# Patient Record
Sex: Female | Born: 1955 | Race: White | Hispanic: No | Marital: Single | Smoking: Never smoker
Health system: Southern US, Community
[De-identification: ages and names within clinical notes are randomized; demographics above are authoritative.]

## PROBLEM LIST (undated history)

## (undated) DIAGNOSIS — I10 Essential (primary) hypertension: Secondary | ICD-10-CM

## (undated) HISTORY — PX: TONSILLECTOMY: SUR1361

---

## 2000-03-25 ENCOUNTER — Emergency Department (HOSPITAL_COMMUNITY): Admission: EM | Admit: 2000-03-25 | Discharge: 2000-03-25 | Payer: Self-pay | Admitting: *Deleted

## 2004-05-25 ENCOUNTER — Encounter (HOSPITAL_COMMUNITY): Admission: RE | Admit: 2004-05-25 | Discharge: 2004-06-07 | Payer: Self-pay | Admitting: Family Medicine

## 2008-05-01 ENCOUNTER — Ambulatory Visit (HOSPITAL_BASED_OUTPATIENT_CLINIC_OR_DEPARTMENT_OTHER): Admission: RE | Admit: 2008-05-01 | Discharge: 2008-05-01 | Payer: Self-pay | Admitting: Family Medicine

## 2008-05-01 ENCOUNTER — Ambulatory Visit: Payer: Self-pay | Admitting: Radiology

## 2008-05-21 ENCOUNTER — Ambulatory Visit: Payer: Self-pay | Admitting: Radiology

## 2008-05-21 ENCOUNTER — Ambulatory Visit (HOSPITAL_BASED_OUTPATIENT_CLINIC_OR_DEPARTMENT_OTHER): Admission: RE | Admit: 2008-05-21 | Discharge: 2008-05-21 | Payer: Self-pay | Admitting: Family Medicine

## 2010-02-24 IMAGING — CR DG FOOT COMPLETE 3+V*R*
3 series · 3 of 3 positions shown · non-contrast
Comparison: No priors

CLINICAL DATA: Fell - pain around fifth metatarsal

RIGHT FOOT COMPLETE - 3+ VIEW

[t foot ap right]
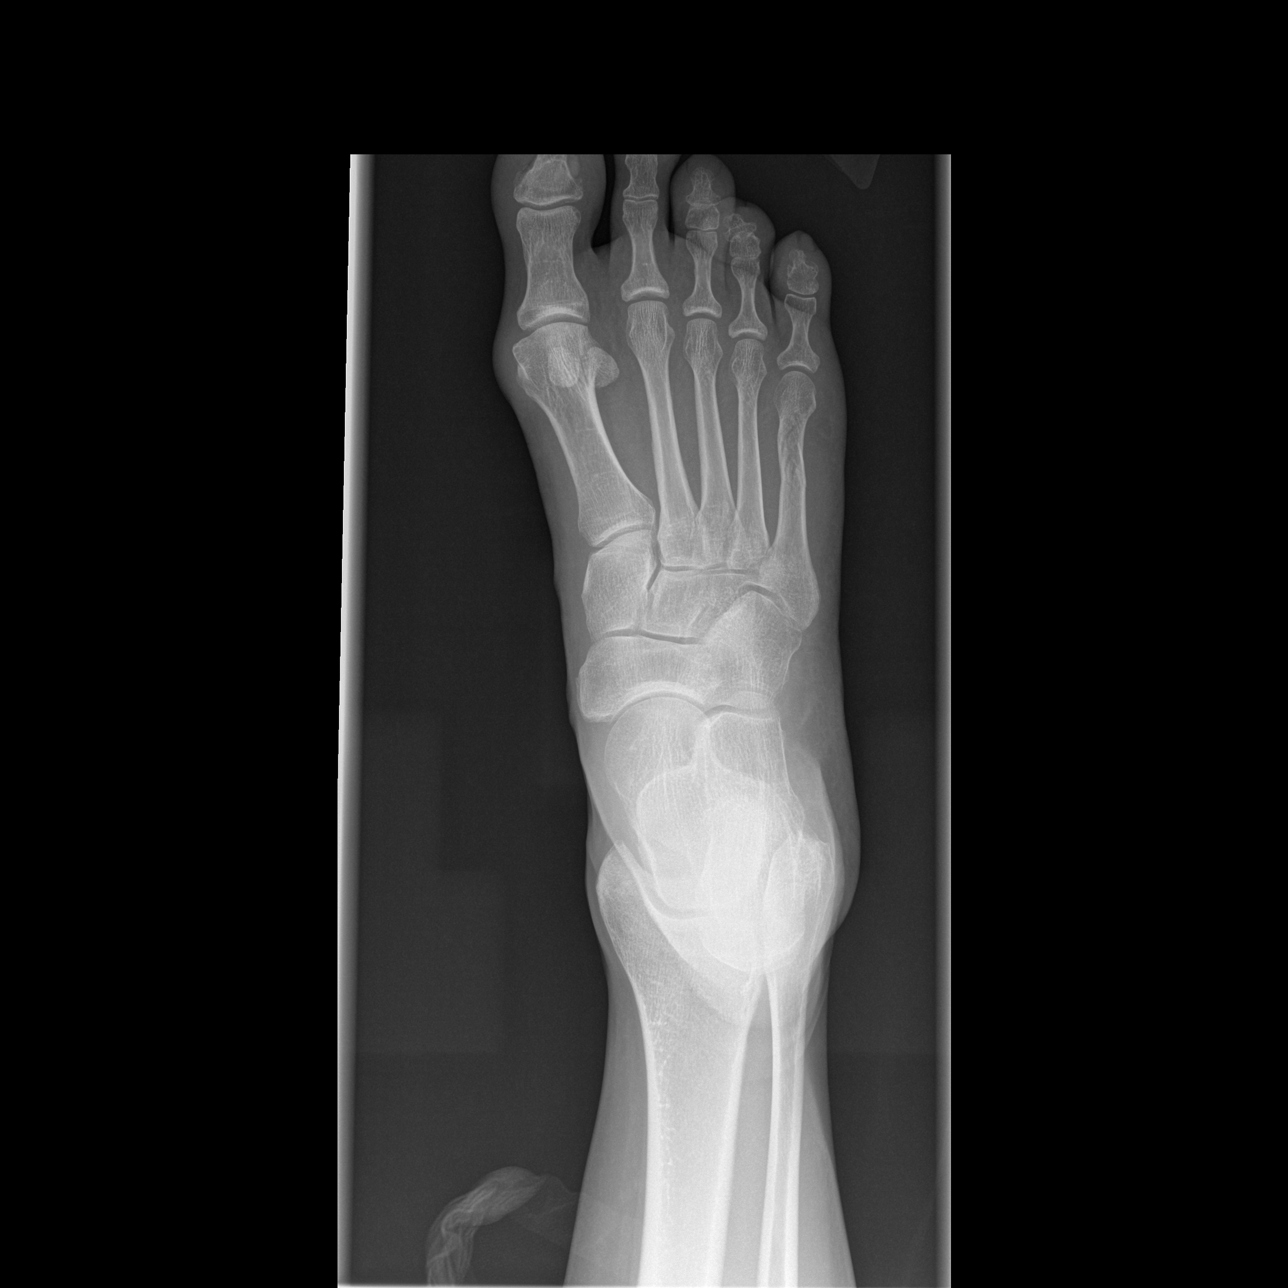

[t foot oblique right]
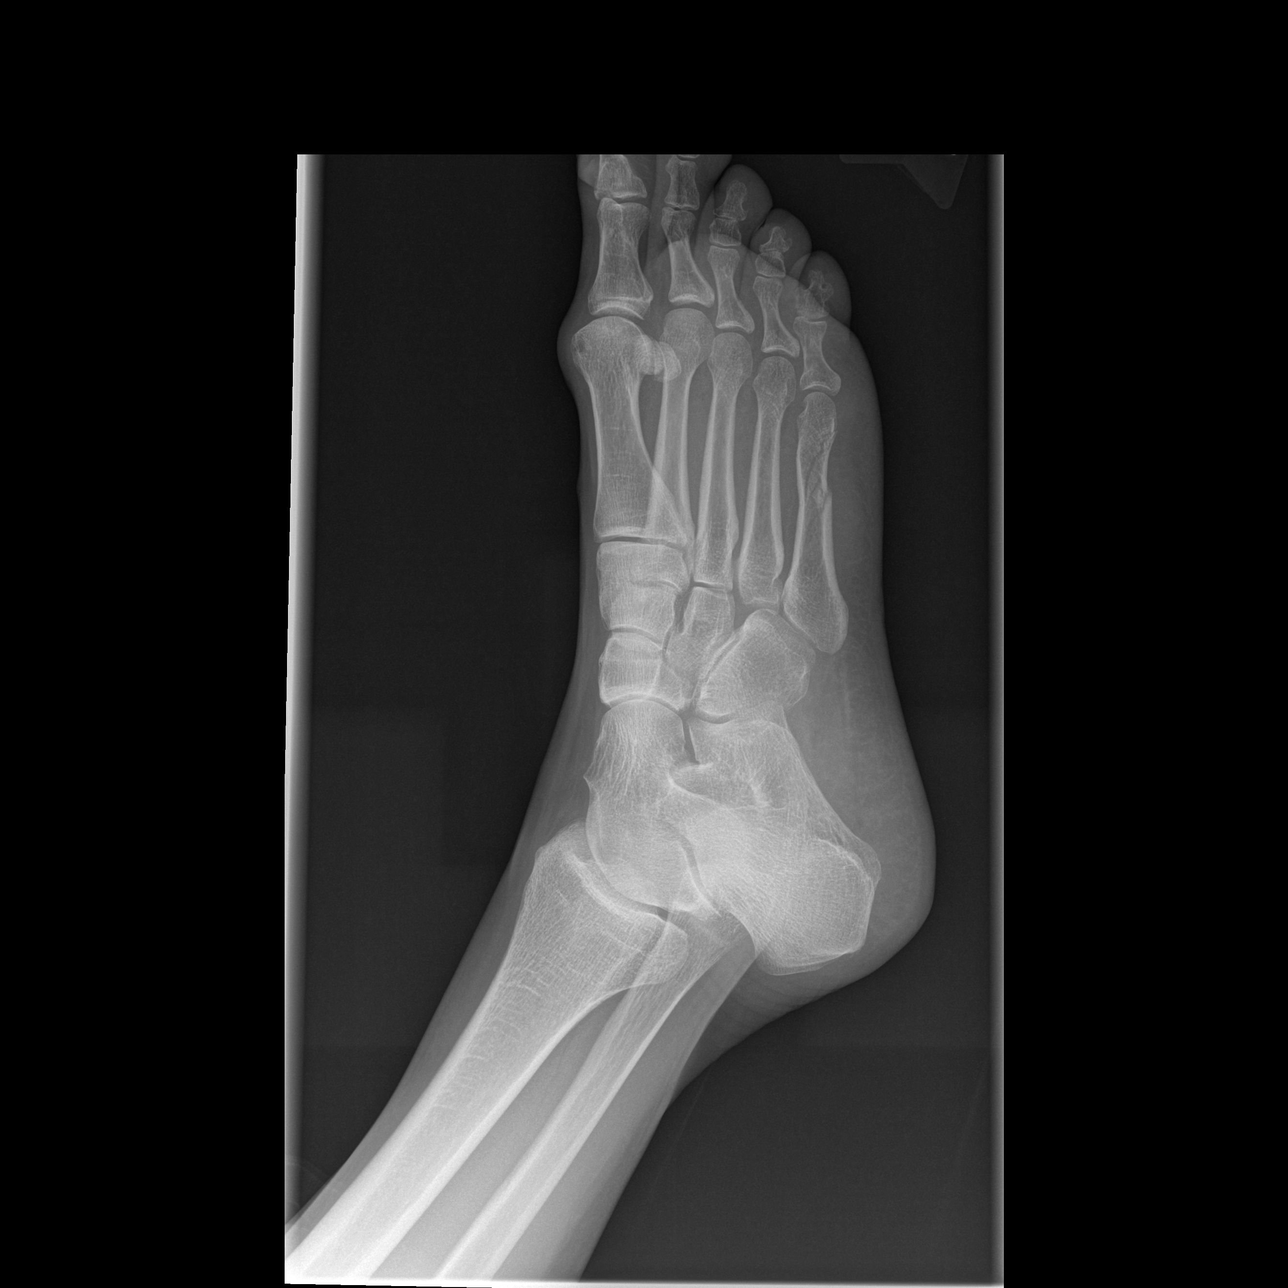

[t foot lat right]
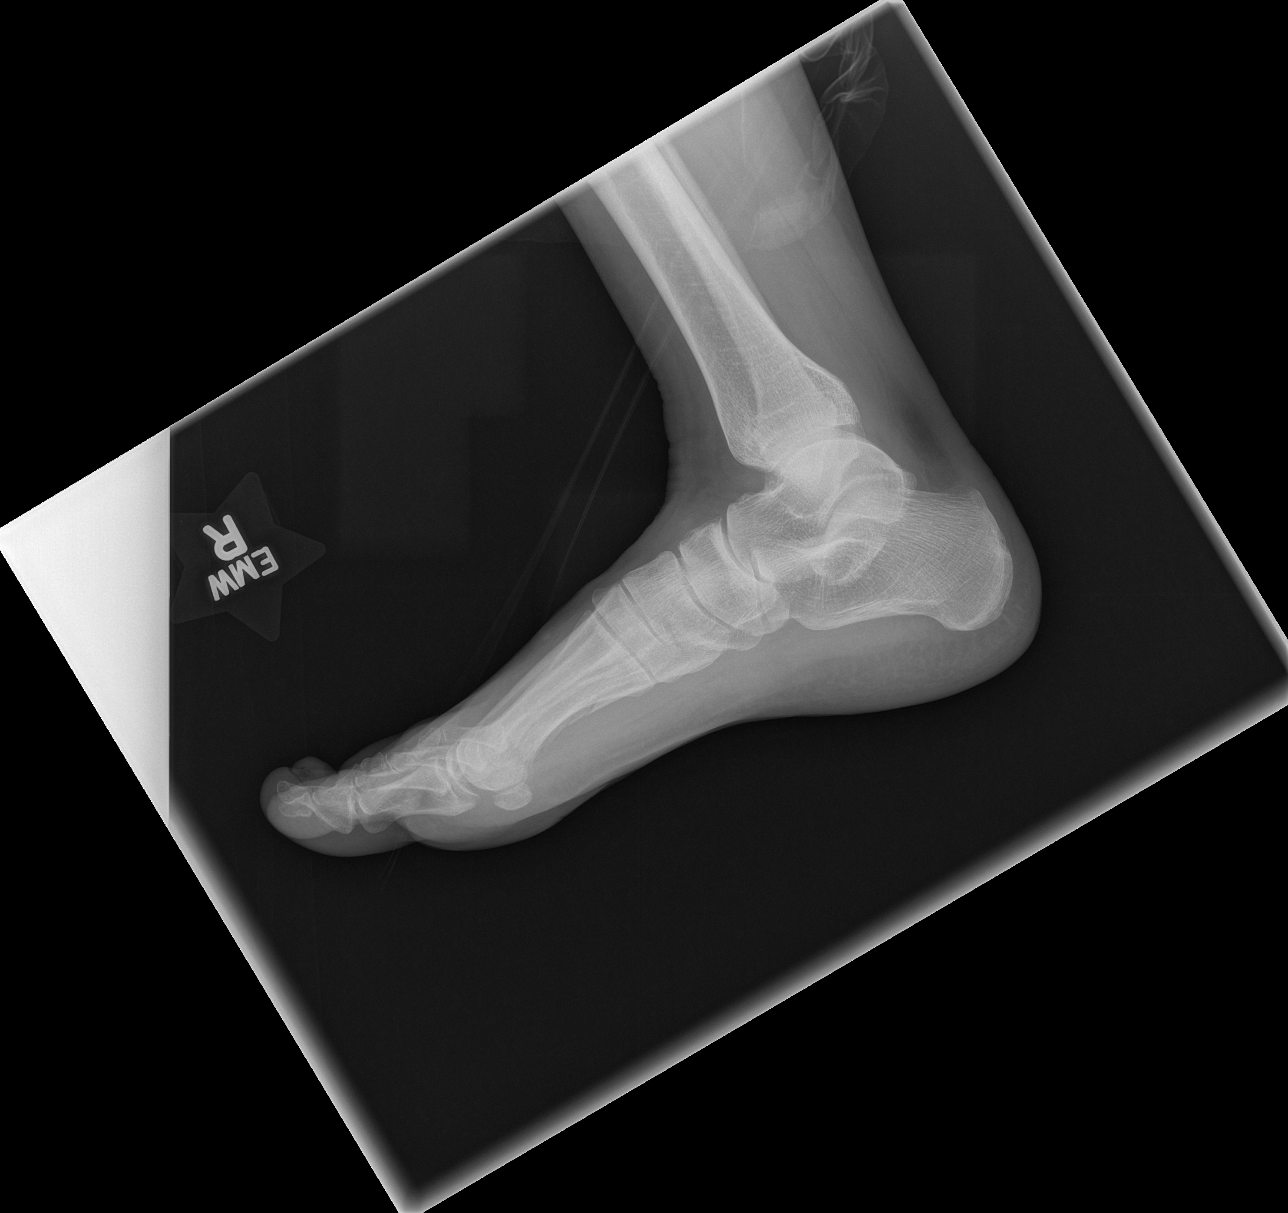

[3 of 3 positions shown; findings below may reference images not displayed]

FINDINGS: There is a comminuted but not significantly angulated
fracture of the mid and distal aspects of the fifth metatarsal.
There are mild degenerative changes of the first MTP joint.
IMPRESSION: Fifth metatarsal fracture.

## 2011-04-30 DEATH — deceased

## 2012-07-03 ENCOUNTER — Emergency Department (HOSPITAL_COMMUNITY)
Admission: EM | Admit: 2012-07-03 | Discharge: 2012-07-03 | Disposition: A | Discharge status: Home / Self Care | Payer: Managed Care, Other (non HMO) | Attending: Emergency Medicine | Admitting: Emergency Medicine

## 2012-07-03 ENCOUNTER — Encounter (HOSPITAL_COMMUNITY): Payer: Self-pay | Admitting: *Deleted

## 2012-07-03 DIAGNOSIS — I10 Essential (primary) hypertension: Secondary | ICD-10-CM | POA: Insufficient documentation

## 2012-07-03 DIAGNOSIS — R110 Nausea: Secondary | ICD-10-CM | POA: Insufficient documentation

## 2012-07-03 DIAGNOSIS — Z79899 Other long term (current) drug therapy: Secondary | ICD-10-CM | POA: Insufficient documentation

## 2012-07-03 DIAGNOSIS — R209 Unspecified disturbances of skin sensation: Secondary | ICD-10-CM | POA: Insufficient documentation

## 2012-07-03 DIAGNOSIS — F439 Reaction to severe stress, unspecified: Secondary | ICD-10-CM

## 2012-07-03 DIAGNOSIS — F411 Generalized anxiety disorder: Secondary | ICD-10-CM | POA: Insufficient documentation

## 2012-07-03 DIAGNOSIS — R0602 Shortness of breath: Secondary | ICD-10-CM | POA: Insufficient documentation

## 2012-07-03 DIAGNOSIS — R51 Headache: Secondary | ICD-10-CM | POA: Insufficient documentation

## 2012-07-03 DIAGNOSIS — F430 Acute stress reaction: Secondary | ICD-10-CM | POA: Insufficient documentation

## 2012-07-03 HISTORY — DX: Essential (primary) hypertension: I10

## 2012-07-03 LAB — POCT I-STAT TROPONIN I: Troponin i, poc: 0 ng/mL (ref 0.00–0.08)

## 2012-07-03 LAB — POCT I-STAT, CHEM 8
Chloride: 106 mEq/L (ref 96–112)
HCT: 38 % (ref 36.0–46.0)
Potassium: 3.8 mEq/L (ref 3.5–5.1)
Sodium: 143 mEq/L (ref 135–145)

## 2012-07-03 NOTE — ED Notes (Signed)
Pt with 6 month hx of htn.  L arm tingling x 1 week.  Pt awoke with headache and some nausea this am (she pulled an all nighter after a very stressful day).

## 2012-07-03 NOTE — ED Provider Notes (Signed)
History     CSN: [id number]  Arrival date & time 07/03/12  1235   First MD Initiated Contact with Patient 07/03/12 1256      Chief Complaint  Patient presents with  . Hypertension  . Tingling    (Consider location/radiation/quality/duration/timing/severity/associated sxs/prior treatment) HPI Comments: 57 y/o F p/w HTN, tingling, and HA. Difficulty with BP control at home x6 months. On losartan. Takes as prescribed. Checks BP >5xd daily at home. Reports numerous stressors at home/work. States she feels anxious. One week of intermittent tingling to proximal LUE. No chest pain or SOB. No associated with exertion. Non-smoker. This AM felt some nausea upon awakening. Stayed awake all night after being up all day. Went to bed at 6AM. Woke up from work phone calls at 8AM. Nausea improved. Developed HA "from all the work stress". Frontal . Similar to prior headaches. Came on quickly, but this no different than prior. HA completely resolved <1 hours. No neck pain or stiffness. No fevers.  Patient is a 57 y.o. female presenting with hypertension and headaches.  Hypertension This is a chronic problem. The current episode started more than 1 month ago. The problem occurs constantly. The problem has been unchanged. Associated symptoms include headaches. Pertinent negatives include no abdominal pain, chest pain, chills, congestion, coughing, fever, nausea, neck pain, rash, vomiting or weakness.  Headache  This is a recurrent problem. The current episode started 3 to 5 hours ago. The problem has been resolved. The headache is associated with emotional stress. The pain is located in the frontal region. The quality of the pain is described as throbbing. The pain is moderate. The pain does not radiate. Associated symptoms include shortness of breath (x1 one week ago). Pertinent negatives include no fever, no nausea and no vomiting.    Past Medical History  Diagnosis Date  . Hypertension     Past Surgical  History  Procedure Date  . Tonsillectomy     No family history on file.  History  Substance Use Topics  . Smoking status: Never Smoker   . Smokeless tobacco: Not on file  . Alcohol Use: 1.8 oz/week    2 Glasses of wine, 1 Shots of liquor per week    OB History    Grav Para Term Preterm Abortions TAB SAB Ect Mult Living                  Review of Systems  Constitutional: Negative for fever and chills.  HENT: Negative for congestion, rhinorrhea, neck pain and neck stiffness.   Eyes: Negative for pain and visual disturbance.  Respiratory: Positive for shortness of breath (x1 one week ago). Negative for cough.   Cardiovascular: Negative for chest pain and leg swelling.  Gastrointestinal: Negative for nausea, vomiting, abdominal pain and diarrhea.  Genitourinary: Negative for dysuria, hematuria, flank pain and difficulty urinating.  Musculoskeletal: Negative for back pain.  Skin: Negative for color change and rash.  Neurological: Positive for headaches. Negative for dizziness, speech difficulty, weakness and light-headedness.  All other systems reviewed and are negative.    Allergies  Review of patient's allergies indicates no known allergies.  Home Medications   Current Outpatient Rx  Name  Route  Sig  Dispense  Refill  . LOSARTAN POTASSIUM 50 MG PO TABS   Oral   Take 50 mg by mouth daily.         Marland Kitchen PROBIOTIC DAILY PO   Oral   Take 1 tablet by mouth daily.         Marland Kitchen  ZINC 25 MG PO TABS   Oral   Take 1 tablet by mouth daily.           BP 148/73  Pulse 60  Temp 98.4 F (36.9 C) (Oral)  Resp 16  SpO2 100%  Physical Exam  Nursing note and vitals reviewed. Constitutional: She is oriented to person, place, and time. She appears well-developed and well-nourished. No distress.  HENT:  Head: Normocephalic and atraumatic.  Eyes: Conjunctivae normal and EOM are normal. Pupils are equal, round, and reactive to light. Right eye exhibits no discharge. Left eye  exhibits no discharge.  Neck: No tracheal deviation present.  Cardiovascular: Normal heart sounds and intact distal pulses.   Pulmonary/Chest: Effort normal and breath sounds normal. No stridor. No respiratory distress. She has no wheezes. She has no rales. She exhibits no tenderness.  Abdominal: Soft. She exhibits no distension. There is no tenderness. There is no guarding.  Musculoskeletal: She exhibits no edema and no tenderness.       NV intact all extremities  Neurological: She is alert and oriented to person, place, and time. She has normal strength. No cranial nerve deficit or sensory deficit. Coordination normal. GCS eye subscore is 4. GCS verbal subscore is 5. GCS motor subscore is 6.  Skin: Skin is warm and dry.  Psychiatric: She has a normal mood and affect. Her behavior is normal.    ED Course  Procedures (including critical care time)  Labs Reviewed  POCT I-STAT, CHEM 8 - Abnormal; Notable for the following:    Creatinine, Ser 0.40 (*)     All other components within normal limits  POCT I-STAT TROPONIN I   No results found.   1. Hypertension   2. Headache   3. Stress      Date: 07/03/2012  Rate: 56  Rhythm: normal sinus rhythm  QRS Axis: normal  Intervals: normal  ST/T Wave abnormalities: normal  Conduction Disutrbances:none  Narrative Interpretation:   Old EKG Reviewed: none available    MDM   57 y/o F h/o HTN poorly controlled p/w concerns over elevated BP, some intermittent arm tingling, nausea and headache. HA similar to prior. Came on quickly, but no different than prior. Not thunderclap worst headache of life. HA resolved. Doubt SAH. Doubt idiopathic intracranial hypertension, cerebral venous thrombosis, temporal arteritis, skull fracture, epidural hematoma, subdural hematoma, or other intracranial hemorrhage, concussion, trigeminal neuralgia, cluster headache, eye pathology or other emergent pathology as this is an atypical history and physical, low  risk, and primary diagnosis is much more likely,.  No chest pain. Doubt ACS. Low risk per TIMI.    Dc home. Return precautions given. Follow up with primary care physician. Patient in agreement with plan  Labs and imaging reviewed by myself and considered in medical decision making if ordered. Imaging interpreted by radiology.   Discussed case with Dr. Radford Pax who is in agreement with assessment and plan.         Stevie Kern, MD 07/03/12 [phone]

## 2012-07-05 NOTE — ED Provider Notes (Signed)
I  reviewed the resident's note and I agree with the findings and plan.     Nelia Shi, MD 07/05/12 1322

## 2013-10-29 DIAGNOSIS — L918 Other hypertrophic disorders of the skin: Secondary | ICD-10-CM | POA: Insufficient documentation

## 2013-10-29 DIAGNOSIS — I10 Essential (primary) hypertension: Secondary | ICD-10-CM | POA: Insufficient documentation

## 2013-10-29 DIAGNOSIS — R5383 Other fatigue: Secondary | ICD-10-CM | POA: Insufficient documentation

## 2014-03-19 ENCOUNTER — Ambulatory Visit: Payer: Self-pay | Admitting: Podiatry

## 2014-03-27 DIAGNOSIS — R252 Cramp and spasm: Secondary | ICD-10-CM | POA: Insufficient documentation

## 2014-03-27 DIAGNOSIS — M79604 Pain in right leg: Secondary | ICD-10-CM | POA: Insufficient documentation

## 2014-12-30 DIAGNOSIS — H02823 Cysts of right eye, unspecified eyelid: Secondary | ICD-10-CM | POA: Insufficient documentation

## 2019-08-12 DIAGNOSIS — J302 Other seasonal allergic rhinitis: Secondary | ICD-10-CM | POA: Insufficient documentation

## 2021-10-20 ENCOUNTER — Telehealth: Payer: Self-pay

## 2021-10-20 NOTE — Telephone Encounter (Signed)
NOTES SCANNED TO REFERRAL 

## 2021-11-08 NOTE — Progress Notes (Unsigned)
Cardiology Office Note   Date:  11/10/2021   ID:  Amber Jimenez, DOB [birth year], MRN [id number]  PCP:  Angelica Chessman, MD  Cardiologist:   None Referring:  Angelica Chessman, MD  Chief Complaint  Patient presents with   Abnormal ECG      History of Present Illness: Amber Jimenez is a 66 y.o. female who is referred by Angelica Chessman, MD for evaluation of an abnormal EKG. she has an EKG that suggest an old anteroseptal MI.  There is also inferior Q waves that could suggest an old inferior MI.  However, I went back and looked at 2014 and this EKG is unchanged.  She has never had any cardiac work-up.  She has never had any testing.  She denies any symptoms.  She is active as a [employer].  This is physical for her. The patient denies any new symptoms such as chest discomfort, neck or arm discomfort. There has been no new shortness of breath, PND or orthopnea. There have been no reported palpitations, presyncope or syncope.  She has done yoga in the past.  Her only symptom is some occasional nighttime coughing.    Past Medical History:  Diagnosis Date   Hypertension     Past Surgical History:  Procedure Laterality Date   TONSILLECTOMY       Current Outpatient Medications  Medication Sig Dispense Refill   amLODipine (NORVASC) 5 MG tablet Take 1 tablet by mouth daily.     Calcium Carb-Cholecalciferol (CALCIUM 1000 + D PO) Take by mouth.     fluticasone (FLONASE) 50 MCG/ACT nasal spray Place into the nose.     loratadine (CLARITIN) 10 MG tablet Take 1 tablet by mouth daily.     losartan (COZAAR) 100 MG tablet Take 100 mg by mouth daily.     Probiotic Product (PROBIOTIC DAILY PO) Take 1 tablet by mouth daily.     Zinc 25 MG TABS Take 1 tablet by mouth daily.     No current facility-administered medications for this visit.    Allergies:   Patient has no known allergies.    Social History:  The patient  reports that she has never smoked. She does not have  any smokeless tobacco history on file. She reports current alcohol use of about 3.0 standard drinks of alcohol per week. She reports that she does not use drugs.   Family History:  The patient's family history includes Bone cancer in her mother.  She has a small family history without coronary disease.   ROS:  Please see the history of present illness.   Otherwise, review of systems are positive for none.   All other systems are reviewed and negative.    PHYSICAL EXAM: VS:  BP (!) 165/88   Pulse 61   Ht 5\' 2"  (1.575 m)   Wt 189 lb 6.4 oz (85.9 kg)   SpO2 97%   BMI 34.64 kg/m  , BMI Body mass index is 34.64 kg/m. GENERAL:  Well appearing HEENT:  Pupils equal round and reactive, fundi not visualized, oral mucosa unremarkable NECK:  No jugular venous distention, waveform within normal limits, carotid upstroke brisk and symmetric, no bruits, no thyromegaly LYMPHATICS:  No cervical, inguinal adenopathy LUNGS:  Clear to auscultation bilaterally BACK:  No CVA tenderness CHEST:  Unremarkable HEART:  PMI not displaced or sustained,S1 and S2 within normal limits, no S3, no S4, no clicks, no rubs, no murmurs ABD:  Flat, positive bowel  sounds normal in frequency in pitch, no bruits, no rebound, no guarding, no midline pulsatile mass, no hepatomegaly, no splenomegaly EXT:  2 plus pulses throughout, no edema, no cyanosis no clubbing SKIN:  No rashes no nodules NEURO:  Cranial nerves II through XII grossly intact, motor grossly intact throughout PSYCH:  Cognitively intact, oriented to person place and time    EKG:  EKG is ordered today. The ekg ordered today demonstrates sinus rhythm, rate 61, axis within normal limits, intervals within normal limits, old anteroseptal infarct possibly inferior Q waves noted   Recent Labs: No results found for requested labs within last 365 days.    Lipid Panel No results found for: "CHOL", "TRIG", "HDL", "CHOLHDL", "VLDL", "LDLCALC", "LDLDIRECT"    Wt  Readings from Last 3 Encounters:  11/10/21 189 lb 6.4 oz (85.9 kg)      Other studies Reviewed: Additional studies/ records that were reviewed today include: Old EKG, labs. Review of the above records demonstrates:  Please see elsewhere in the note.     ASSESSMENT AND PLAN:  Abnormal EKG: Her EKG does suggest possible prior anteroseptal and possible inferior infarct.  I will check an echocardiogram.  If these are normal, in the absence of symptoms I would not suggest further testing.  HTN: Her blood pressure is elevated.  I suggested increasing her Norvasc and she does not want to do this.  She wants to try 10 pounds of weight loss first.  I told her she needs to keep a blood pressure diary and let us know if she might need an increase in her meds if this does not work or she might need a third agent.  Current medicines are reviewed at length with the patient today.  The patient does not have concerns regarding medicines.  The following changes have been made:  no change  Labs/ tests ordered today include:   Orders Placed This Encounter  Procedures   EKG 12-Lead   ECHOCARDIOGRAM COMPLETE     Disposition:   FU with me as needed based on the results of the above   Signed, Rollene Rotunda, MD  11/10/2021 8:51 AM    [facility]

## 2021-11-10 ENCOUNTER — Ambulatory Visit (INDEPENDENT_AMBULATORY_CARE_PROVIDER_SITE_OTHER): Payer: Medicare Other | Admitting: Cardiology

## 2021-11-10 ENCOUNTER — Encounter: Payer: Self-pay | Admitting: Cardiology

## 2021-11-10 VITALS — BP 165/88 | HR 61 | Ht 62.0 in | Wt 189.4 lb

## 2021-11-10 DIAGNOSIS — R9431 Abnormal electrocardiogram [ECG] [EKG]: Secondary | ICD-10-CM

## 2021-11-10 NOTE — Patient Instructions (Signed)
  Testing/Procedures:  Your physician has requested that you have an echocardiogram. Echocardiography is a painless test that uses sound waves to create images of your heart. It provides your doctor with information about the size and shape of your heart and how well your heart's chambers and valves are working. This procedure takes approximately one hour. There are no restrictions for this procedure. [address]   Follow-Up: At [facility], you and your health needs are our priority.  As part of our continuing mission to provide you with exceptional heart care, we have created designated Provider Care Teams.  These Care Teams include your primary Cardiologist (physician) and Advanced Practice Providers (APPs -  Physician Assistants and Nurse Practitioners) who all work together to provide you with the care you need, when you need it.  We recommend signing up for the patient portal called "MyChart".  Sign up information is provided on this After Visit Summary.  MyChart is used to connect with patients for Virtual Visits (Telemedicine).  Patients are able to view lab/test results, encounter notes, upcoming appointments, etc.  Non-urgent messages can be sent to your provider as well.   To learn more about what you can do with MyChart, go to [email or web address].    Your next appointment:    AS NEEDED  Important Information About Sugar       

## 2021-11-19 ENCOUNTER — Ambulatory Visit (INDEPENDENT_AMBULATORY_CARE_PROVIDER_SITE_OTHER): Payer: Medicare Other

## 2021-11-19 DIAGNOSIS — R9431 Abnormal electrocardiogram [ECG] [EKG]: Secondary | ICD-10-CM

## 2021-11-19 LAB — ECHOCARDIOGRAM COMPLETE
AR max vel: 2.18 cm2
AV Area VTI: 2.07 cm2
AV Area mean vel: 2.06 cm2
AV Mean grad: 5 mmHg
AV Peak grad: 9.4 mmHg
Ao pk vel: 1.53 m/s
Area-P 1/2: 3.19 cm2
Calc EF: 62.2 %
S' Lateral: 2.76 cm
Single Plane A2C EF: 61.7 %
Single Plane A4C EF: 60.4 %

## 2021-11-22 ENCOUNTER — Encounter: Payer: Self-pay | Admitting: *Deleted

## 2022-01-15 ENCOUNTER — Encounter (HOSPITAL_COMMUNITY): Payer: Self-pay | Admitting: Emergency Medicine

## 2022-01-15 ENCOUNTER — Emergency Department (HOSPITAL_COMMUNITY): Payer: Medicare Other

## 2022-01-15 ENCOUNTER — Other Ambulatory Visit: Payer: Self-pay

## 2022-01-15 ENCOUNTER — Emergency Department (HOSPITAL_COMMUNITY)
Admission: EM | Admit: 2022-01-15 | Discharge: 2022-01-15 | Disposition: A | Discharge status: Home / Self Care | Payer: Medicare Other | Attending: Emergency Medicine | Admitting: Emergency Medicine

## 2022-01-15 DIAGNOSIS — I10 Essential (primary) hypertension: Secondary | ICD-10-CM | POA: Insufficient documentation

## 2022-01-15 DIAGNOSIS — Z79899 Other long term (current) drug therapy: Secondary | ICD-10-CM | POA: Insufficient documentation

## 2022-01-15 DIAGNOSIS — N132 Hydronephrosis with renal and ureteral calculous obstruction: Secondary | ICD-10-CM | POA: Insufficient documentation

## 2022-01-15 DIAGNOSIS — N201 Calculus of ureter: Secondary | ICD-10-CM

## 2022-01-15 DIAGNOSIS — R109 Unspecified abdominal pain: Secondary | ICD-10-CM | POA: Diagnosis present

## 2022-01-15 LAB — URINALYSIS, ROUTINE W REFLEX MICROSCOPIC
Bilirubin Urine: NEGATIVE
Glucose, UA: NEGATIVE mg/dL
Ketones, ur: NEGATIVE mg/dL
Nitrite: NEGATIVE
Protein, ur: NEGATIVE mg/dL
RBC / HPF: >50 RBC/hpf — ABNORMAL HIGH (ref 0–5)
Specific Gravity, Urine: 1.014 (ref 1.005–1.030)
pH: 5 (ref 5.0–8.0)

## 2022-01-15 LAB — BASIC METABOLIC PANEL
Anion gap: 9 (ref 5–15)
BUN: 14 mg/dL (ref 8–23)
CO2: 24 mmol/L (ref 22–32)
Calcium: 9.1 mg/dL (ref 8.9–10.3)
Chloride: 101 mmol/L (ref 98–111)
Creatinine, Ser: 0.72 mg/dL (ref 0.44–1.00)
GFR, Estimated: >60 mL/min (ref >60)
Glucose, Bld: 138 mg/dL — ABNORMAL HIGH (ref 70–99)
Potassium: 3.5 mmol/L (ref 3.5–5.1)
Sodium: 134 mmol/L — ABNORMAL LOW (ref 135–145)

## 2022-01-15 LAB — CBC
HCT: 39.8 % (ref 36.0–46.0)
Hemoglobin: 12.8 g/dL (ref 12.0–15.0)
MCH: 27.9 pg (ref 26.0–34.0)
MCHC: 32.2 g/dL (ref 30.0–36.0)
MCV: 86.9 fL (ref 80.0–100.0)
Platelets: 288 10*3/uL (ref 150–400)
RBC: 4.58 MIL/uL (ref 3.87–5.11)
RDW: 13.8 % (ref 11.5–15.5)
WBC: 6.9 10*3/uL (ref 4.0–10.5)
nRBC: 0 % (ref 0.0–0.2)

## 2022-01-15 MED ORDER — OXYCODONE-ACETAMINOPHEN 5-325 MG PO TABS: 1.0000 | ORAL_TABLET | Freq: Three times a day (TID) | ORAL | 0 refills | Status: AC | PRN

## 2022-01-15 MED ORDER — TAMSULOSIN HCL 0.4 MG PO CAPS: 0.4000 mg | ORAL_CAPSULE | Freq: Every day | ORAL | 0 refills | Status: AC

## 2022-01-15 NOTE — ED Triage Notes (Signed)
Patient with left flank pain, radiates into her back. She states that she feels like she has to urinate, but only drops are coming out.  She states that she has tried.

## 2022-01-15 NOTE — ED Notes (Signed)
Pt reports having thrown up 20 min ago, @1120 

## 2022-01-15 NOTE — ED Notes (Signed)
Pt provided written and verbal d/c instructions. Rx sent digitally. Pt verbalizes understanding. Pt left dept. Via ambulation.

## 2022-01-15 NOTE — ED Provider Notes (Signed)
[facility] EMERGENCY DEPARTMENT Provider Note   CSN: [id number] Arrival date & time: 01/15/22  0541     History  Chief Complaint  Patient presents with   Flank Pain    Amber Jimenez is a 66 y.o. female with a history of hypertension.  Presents the emergency department the chief complaint of left flank pain.  Patient states that the pain started suddenly midnight.  Pain was located to her left flank and did not radiate.  Patient rated pain 7/10 on the pain scale.  Patient reports that she had 2 episodes of vomiting at home.  Denies any hematemesis or coffee-ground emesis.  Patient reports that she increased her water intake at home.  Patient states that while at home she did have a few episodes of difficulty urinating where she only produced a few drops of urine.  Patient states that since being in the emergency department she has been able to urinate without difficulty.  She has had 1 episode of nausea and vomiting approximately 11 AM, however has been able to drink coffee after this incident without issue.  Patient also reports that she had marked improvement in her pain, currently rates pain 3/10 on the pain scale.  Denies any history of abdominal surgery, illicit drug use, or alcohol use.  Patient had bowel movement earlier today.  Denies any fever, chills, hematuria, vaginal pain, vaginal bleeding, vaginal discharge, abdominal pain, constipation, diarrhea, blood in stool, melena.   Flank Pain Pertinent negatives include no abdominal pain and no headaches.       Home Medications Prior to Admission medications   Medication Sig Start Date End Date Taking? Authorizing Provider  amLODipine (NORVASC) 5 MG tablet Take 1 tablet by mouth daily. 11/21/16   [provider]  Calcium Carb-Cholecalciferol (CALCIUM 1000 + D PO) Take by mouth.    [provider]  fluticasone (FLONASE) 50 MCG/ACT nasal spray Place into the nose. 08/22/14   [provider]  loratadine (CLARITIN) 10 MG tablet Take 1 tablet by mouth daily. 08/12/19   [provider]  losartan (COZAAR) 100 MG tablet Take 100 mg by mouth daily. 10/30/21   [provider]  Probiotic Product (PROBIOTIC DAILY PO) Take 1 tablet by mouth daily.    [provider]  Zinc 25 MG TABS Take 1 tablet by mouth daily.    [provider]      Allergies    Patient has no known allergies.    Review of Systems   Review of Systems  Constitutional:  Negative for chills and fever.  Gastrointestinal:  Positive for nausea and vomiting. Negative for abdominal distention, abdominal pain, anal bleeding, blood in stool, constipation and diarrhea.  Genitourinary:  Positive for difficulty urinating and flank pain. Negative for dysuria, hematuria, vaginal bleeding, vaginal discharge and vaginal pain.  Musculoskeletal:  Negative for back pain and neck pain.  Skin:  Negative for color change and rash.  Neurological:  Negative for dizziness, syncope, light-headedness and headaches.  Psychiatric/Behavioral:  Negative for confusion.     Physical Exam Updated Vital Signs BP 133/69 (BP Location: Right Arm)   Pulse 62   Temp (!) 97.4 F (36.3 C) (Oral)   Resp 18   SpO2 95%  Physical Exam Vitals and nursing note reviewed.  Constitutional:      General: She is not in acute distress.    Appearance: She is not ill-appearing, toxic-appearing or diaphoretic.  HENT:     Head: Normocephalic.  Eyes:     General: No scleral icterus.       Right eye: No discharge.        Left eye: No discharge.  Cardiovascular:     Rate and Rhythm: Normal rate.  Pulmonary:     Effort: Pulmonary effort is normal.  Abdominal:     General: Abdomen is flat. Bowel sounds are normal. There is no distension. There are no signs of injury.     Palpations: Abdomen is soft. There is no mass or pulsatile mass.     Tenderness: There is no abdominal tenderness. There is no right CVA  tenderness, left CVA tenderness, guarding or rebound.     Hernia: There is no hernia in the umbilical area or ventral area.  Skin:    General: Skin is warm and dry.  Neurological:     General: No focal deficit present.     Mental Status: She is alert.     GCS: GCS eye subscore is 4. GCS verbal subscore is 5. GCS motor subscore is 6.  Psychiatric:        Behavior: Behavior is cooperative.     ED Results / Procedures / Treatments   Labs (all labs ordered are listed, but only abnormal results are displayed) Labs Reviewed  URINALYSIS, ROUTINE W REFLEX MICROSCOPIC - Abnormal; Notable for the following components:      Result Value   Hgb urine dipstick MODERATE (*)    Leukocytes,Ua TRACE (*)    RBC / HPF >50 (*)    Bacteria, UA RARE (*)    All other components within normal limits  BASIC METABOLIC PANEL - Abnormal; Notable for the following components:   Sodium 134 (*)    Glucose, Bld 138 (*)    All other components within normal limits  CBC    EKG None  Radiology CT Renal Stone Study  Result Date: 01/15/2022 CLINICAL DATA:  66 year old female with acute left flank pain radiating to the back. Urinary urgency and dysuria. EXAM: CT ABDOMEN AND PELVIS WITHOUT CONTRAST TECHNIQUE: Multidetector CT imaging of the abdomen and pelvis was performed following the standard protocol without IV contrast. RADIATION DOSE REDUCTION: This exam was performed according to the departmental dose-optimization program which includes automated exposure control, adjustment of the mA and/or kV according to patient size and/or use of iterative reconstruction technique. COMPARISON:  None Available. FINDINGS: Lower chest: Borderline to mild cardiomegaly. Negative lung bases. No pericardial or pleural effusion. Hepatobiliary: 16 mm lipid laden gallstone in the neck of the gallbladder but no pericholecystic inflammation. Negative noncontrast liver. Pancreas: Atrophied. Spleen: Negative. Adrenals/Urinary Tract:  Normal adrenal glands. Negative noncontrast right kidney and ureter. Hydronephrotic left kidney. Punctate left lower pole nephrolithiasis. Left hydroureter with mild periureteral stranding continuing into the pelvis. And there is 5 mm elongated calculus at the ureterovesical junction on series 3, image 78 and coronal image 85. Numerous superimposed pelvic phleboliths. Otherwise negative urinary bladder. Stomach/Bowel: Mild sigmoid diverticulosis. Decompressed large bowel from the splenic flexure distally. Retained gas and stool in the right and transverse colon. Apparent postoperative changes at the tip of the cecum (coronal image 55) but also the appendix remains and is normal (series 3, images 57-61). No dilated small bowel. Stomach and duodenum appear negative. No free air or free fluid. Vascular/Lymphatic: Aortoiliac calcified atherosclerosis. Normal caliber abdominal aorta. No lymphadenopathy identified. Reproductive: Negative noncontrast appearance. Other: No pelvic free fluid.  Numerous pelvic phleboliths. Musculoskeletal: Intermittent disc degeneration and vacuum disc, especially at the lumbosacral junction.  No acute osseous abnormality identified. IMPRESSION: 1. Acute obstructive uropathy on the left due to a 5 mm stone at the left UVJ. 2. Punctate left lower pole nephrolithiasis. 3. Cholelithiasis. 4. Aortic Atherosclerosis (ICD10-I70.0). Electronically Signed   By: Odessa Fleming M.D.   On: 01/15/2022 07:25    Procedures Procedures    Medications Ordered in ED Medications - No data to display  ED Course/ Medical Decision Making/ A&P Clinical Course as of 01/15/22 1237  Sat Jan 15, 2022  1219 Stable 66 YOF. HX of HTN. Left sided flank pain x15 hours with worsening throughout the night. +N/V Has obstructive UL with mild hydro. Labs OK.  [CC]    Clinical Course User Index [CC] Glyn Ade, MD                           Medical Decision Making Amount and/or Complexity of Data Reviewed Labs:  ordered. Radiology: ordered.  Risk Prescription drug management.   Alert 66 year old female in no acute distress, nontoxic-appearing.  Presents to the emergency department with a chief complaint of left flank pain.  Information was obtained from patient.  I reviewed patient's past medical records including previous divider notes, labs, and imaging.  Patient has medical history of hypertension which complicates her care.  Due to reports of left flank pain BMP, CBC, urinalysis, CT renal study were obtained while patient was in triage.  I personally viewed and interpreted patient's lab results.  Pertinent findings include: -No leukocytosis -BUN and creatinine within normal limits -Urinalysis shows RBC greater than 50, leukocyte trace, bacteria rare, WBC 6-10  I personally viewed and interpreted patient CT imaging.  Agree with radiology interpretation of acute obstructive uropathy on the left due to a 5 mm stone at the left UVJ.  Patient reports marked improvement in her pain at this time.  Abdomen is soft, nondistended, nontender with no guarding or rebound tenderness.  Negative CVA tenderness bilaterally.  Patient has been able to urinate without difficulty while in the emergency department.  Patient was offered pain medication however declines at this time.  Patient was offered antinausea medication however states she no longer has any nausea.  We will plan to p.o. challenge patient at this time.  With no leukocytosis, UTI seen on urinalysis, or increased creatinine patient appears stable for discharge at this time.  We will prescribe patient with Flomax and course of pain medication.  Patient follow-up with urology in the outpatient setting.  Patient was discussed with and evaluated by Dr. Doran Durand.  Based on patient's chief complaint, I considered admission might be necessary, however after reassuring ED workup feel patient is reasonable for discharge.  Discussed results, findings,  treatment and follow up. Patient advised of return precautions. Patient verbalized understanding and agreed with plan.  Portions of this note were generated with Scientist, clinical (histocompatibility and immunogenetics). Dictation errors may occur despite best attempts at proofreading.         Final Clinical Impression(s) / ED Diagnoses Final diagnoses:  Ureterolithiasis    Rx / DC Orders ED Discharge Orders          Ordered    oxyCODONE-acetaminophen (PERCOCET/ROXICET) 5-325 MG tablet  Every 8 hours PRN        01/15/22 1222    tamsulosin (FLOMAX) 0.4 MG CAPS capsule  Daily        01/15/22 1222              Haskel Schroeder, [place]  01/15/22 1237    Glyn Ade, MD 01/15/22 1417

## 2022-01-15 NOTE — Discharge Instructions (Addendum)
You came to the emergency department today to be evaluated for your left flank pain.  The CT scan obtained showed that you had a 5 mm kidney stone.  Due to this I have given you prescription for tamsulosin which will increase your urine output and attempt to help you pass the stone.  I have also given you prescription for Percocet pain medication which you may take as needed for severe pain.  You will need to follow-up with [facility] for further management of your kidney stone.  You are being prescribed a medication which may make you sleepy or mpair your ability to make decisions.  For 24 hours after taking this medication please do not drive, operate heavy machinery, care for a small child with out another adult present, or perform any activities that may cause harm to you or someone else if you were to fall asleep or be impaired.   Get help right away if: You have a fever or chills. You develop severe pain. You develop new abdominal pain. You faint. You are unable to urinate.

## 2023-10-16 ENCOUNTER — Telehealth: Payer: Self-pay | Admitting: Family Medicine

## 2023-10-16 NOTE — Telephone Encounter (Signed)
 Copied from CRM [id number]. Topic: Clinical - Medication Refill >> Oct 16, 2023 11:37 AM Janeecia G wrote: Medication: amlodipine and losartan  Has the patient contacted their pharmacy? No (Agent: If no, request that the patient contact the pharmacy for the refill. If patient does not wish to contact the pharmacy document the reason why and proceed with request.) (Agent: If yes, when and what did the pharmacy advise?) Preferred phjaramcy  [id number] - [place], [place] - [address] AT [address] [place]  [address] Phone: [phone] Fax: [phone]  Is this the correct pharmacy for this prescription? Yes If no, delete pharmacy and type the correct one.   Has the prescription been filled recently? Yes  Is the patient out of the medication? No  Has the patient been seen for an appointment in the last year OR does the patient have an upcoming appointment? Yes  Can we respond through MyChart? Yes  Agent: Please be advised that Rx refills may take up to 3 business days. We ask that you follow-up with your pharmacy.

## 2023-11-27 ENCOUNTER — Encounter: Payer: Self-pay | Admitting: Family Medicine

## 2023-11-27 ENCOUNTER — Ambulatory Visit (INDEPENDENT_AMBULATORY_CARE_PROVIDER_SITE_OTHER): Admitting: Family Medicine

## 2023-11-27 VITALS — BP 134/84 | HR 81 | Ht 62.0 in | Wt 197.2 lb

## 2023-11-27 DIAGNOSIS — E7800 Pure hypercholesterolemia, unspecified: Secondary | ICD-10-CM

## 2023-11-27 DIAGNOSIS — Z1211 Encounter for screening for malignant neoplasm of colon: Secondary | ICD-10-CM

## 2023-11-27 DIAGNOSIS — I10 Essential (primary) hypertension: Secondary | ICD-10-CM

## 2023-11-27 MED ORDER — AMLODIPINE BESYLATE 5 MG PO TABS: 5.0000 mg | ORAL_TABLET | Freq: Every day | ORAL | 1 refills | Status: DC

## 2023-11-27 MED ORDER — LOSARTAN POTASSIUM 100 MG PO TABS: 100.0000 mg | ORAL_TABLET | Freq: Every day | ORAL | 1 refills | Status: DC

## 2023-11-27 NOTE — Progress Notes (Signed)
 Patient Office Visit  Assessment & Plan:  Primary hypertension -     amLODIPine Besylate; Take 1 tablet (5 mg total) by mouth daily.  Dispense: 90 tablet; Refill: 1 -     Losartan Potassium; Take 1 tablet (100 mg total) by mouth daily.  Dispense: 90 tablet; Refill: 1 -     CBC with Differential/Platelet -     Comprehensive metabolic panel with GFR  Elevated cholesterol -     Lipid panel  Screening for colorectal cancer -     Ambulatory referral to Gastroenterology   Assessment and Plan    Hypertension Hypertension is well-controlled with current medication. - Continue current antihypertensive medication. - Encourage resumption of regular exercise as weather permits.  Hyperlipidemia Cholesterol levels are elevated. She prefers dietary management over pharmacological interventions. - Encourage dietary modifications to manage cholesterol.  Colorectal Cancer Screening She is due for a colonoscopy and has agreed to the procedure. Concerns about sedation were addressed. - Refer to [facility] for colonoscopy with a female physician.  Breast Cancer Screening She has not had a mammogram since 2023 and is resistant to scheduling one. She was reminded of the importance of regular screenings. - Encourage scheduling a mammogram. patient is aware of risk of not doing so  General Health Maintenance She is up to date with tetanus vaccination and plans to receive pneumonia and influenza vaccinations in the fall. Blood work is due. - Order blood work. - Schedule pneumonia and influenza vaccinations for the fall.  Follow-up Follow-up includes coordination with pharmacy and specialist referrals. - Send prescriptions to [facility]. - Ensure gastroenterology office contacts her for colonoscopy scheduling. - Plan for a physical exam at the next visit.          Return if symptoms worsen or fail to improve, for physical.   Subjective:    Patient ID: MENDI CONSTABLE, female    DOB:  1956-05-29  Age: 68 y.o. MRN: [id number]  Chief Complaint  Patient presents with   Medical Management of Chronic Issues   Establish Care    HPI Discussed the use of AI scribe software for clinical note transcription with the patient, who gave verbal consent to proceed.  History of Present Illness   OTHELLO SGROI is a 68 year old female with hypertension who presents for a follow-up visit and establish primary care here.   Her blood pressure is well-controlled with her current medication regimen. She adheres to her medication and lifestyle modifications, although recent hot weather has limited her ability to exercise outdoors. Previously, she was walking 10,000 steps a day and feeling good.  She is considering a colonoscopy but has concerns about the preparation and sedation involved. She prefers a female doctor for the procedure.  She is reluctant to take cholesterol medication, preferring to manage her cholesterol through dietary changes. She is cautious about taking medications and prefers natural methods as long as possible.  She mentions receiving a tetanus shot at previous office following an injury, which was less than ten years ago. She typically receives a pneumonia shot in the fall along with her flu shot. patient does not need another pneumonia shot   She works at [employer] and has been there for seventeen years. She lives close to her workplace and has a son living in a condo on the other side of town.     Hypertension Hypertension is well-controlled with current medication. - Continue current antihypertensive medication. - Encourage resumption of regular exercise as  weather permits.  Hyperlipidemia Cholesterol levels are elevated. She prefers dietary management over pharmacological interventions. - Encourage dietary modifications to manage cholesterol.  Colorectal Cancer Screening She is due for a colonoscopy and has agreed to the procedure. Concerns about  sedation were addressed. - Refer to [facility] for colonoscopy with a female physician.  Breast Cancer Screening She has not had a mammogram since 2023 and is resistant to scheduling one. She was reminded of the importance of regular screenings. - Encourage scheduling a mammogram. patient is aware of risk of not doing so  General Health Maintenance She is up to date with tetanus vaccination and plans to receive pneumonia and influenza vaccinations in the fall. Blood work is due. - Order blood work. - Schedule pneumonia and influenza vaccinations for the fall.  Follow-up Follow-up includes coordination with pharmacy and specialist referrals. - Send prescriptions to [facility]. - Ensure gastroenterology office contacts her for colonoscopy scheduling. - Plan for a physical exam at the next visit.   The 10-year ASCVD risk score (Arnett DK, et al., 2019) is: 12%   Values used to calculate the score:     Age: 70 years     Clincally relevant sex: Female     Is Non-Hispanic African American: No     Diabetic: No     Tobacco smoker: No     Systolic Blood Pressure: 134 mmHg     Is BP treated: Yes     HDL Cholesterol: 60 mg/dL     Total Cholesterol: 195 mg/dL  The 89-bzjm ASCVD risk score (Arnett DK, et al., 2019) is: 12.3%  Past Medical History:  Diagnosis Date   Hypertension    Past Surgical History:  Procedure Laterality Date   TONSILLECTOMY     Social History   Tobacco Use   Smoking status: Never  Substance Use Topics   Alcohol use: Yes    Alcohol/week: 3.0 standard drinks of alcohol    Types: 2 Glasses of wine, 1 Shots of liquor per week   Drug use: No   Family History  Problem Relation Age of Onset   Bone cancer Mother    No Known Allergies  ROS    Objective:    BP 134/84   Pulse 81   Ht 5' 2 (1.575 m)   Wt 197 lb 4 oz (89.5 kg)   SpO2 98%   BMI 36.08 kg/m  BP Readings from Last 3 Encounters:  11/27/23 134/84  01/15/22 (!) 171/84  11/10/21 (!) 165/88    Wt Readings from Last 3 Encounters:  11/27/23 197 lb 4 oz (89.5 kg)  11/10/21 189 lb 6.4 oz (85.9 kg)    Physical Exam Vitals and nursing note reviewed.  Constitutional:      Appearance: Normal appearance.  HENT:     Head: Normocephalic.     Right Ear: Tympanic membrane, ear canal and external ear normal.     Left Ear: Tympanic membrane, ear canal and external ear normal.   Eyes:     Extraocular Movements: Extraocular movements intact.     Conjunctiva/sclera: Conjunctivae normal.     Pupils: Pupils are equal, round, and reactive to light.    Cardiovascular:     Rate and Rhythm: Normal rate and regular rhythm.     Heart sounds: Normal heart sounds.  Pulmonary:     Effort: Pulmonary effort is normal.     Breath sounds: Normal breath sounds.   Musculoskeletal:     Right lower leg: No edema.  Left lower leg: No edema.   Neurological:     General: No focal deficit present.     Mental Status: She is alert and oriented to person, place, and time.   Psychiatric:        Mood and Affect: Mood normal.        Behavior: Behavior normal.        Thought Content: Thought content normal.        Judgment: Judgment normal.      No results found for any visits on 11/27/23.

## 2023-11-28 ENCOUNTER — Ambulatory Visit: Payer: Self-pay | Admitting: Family Medicine

## 2023-11-30 LAB — COMPREHENSIVE METABOLIC PANEL WITH GFR
AG Ratio: 1.3 (calc) (ref 1.0–2.5)
ALT: 15 U/L (ref 6–29)
AST: 19 U/L (ref 10–35)
Albumin: 4 g/dL (ref 3.6–5.1)
Alkaline phosphatase (APISO): 116 U/L (ref 37–153)
BUN: 15 mg/dL (ref 7–25)
CO2: 29 mmol/L (ref 20–32)
Calcium: 9.4 mg/dL (ref 8.6–10.4)
Chloride: 101 mmol/L (ref 98–110)
Creat: 0.6 mg/dL (ref 0.50–1.05)
Globulin: 3.1 g/dL (ref 1.9–3.7)
Glucose, Bld: 113 mg/dL — ABNORMAL HIGH (ref 65–99)
Potassium: 4.2 mmol/L (ref 3.5–5.3)
Sodium: 138 mmol/L (ref 135–146)
Total Bilirubin: 0.5 mg/dL (ref 0.2–1.2)
Total Protein: 7.1 g/dL (ref 6.1–8.1)
eGFR: 97 mL/min/{1.73_m2} (ref >=60)

## 2023-11-30 LAB — CBC WITH DIFFERENTIAL/PLATELET
Absolute Lymphocytes: 2576 {cells}/uL (ref 850–3900)
Absolute Monocytes: 920 {cells}/uL (ref 200–950)
Basophils Absolute: 38 {cells}/uL (ref 0–200)
Basophils Relative: 0.5 %
Eosinophils Absolute: 160 {cells}/uL (ref 15–500)
Eosinophils Relative: 2.1 %
HCT: 40.4 % (ref 35.0–45.0)
Hemoglobin: 12.8 g/dL (ref 11.7–15.5)
MCH: 28.7 pg (ref 27.0–33.0)
MCHC: 31.7 g/dL — ABNORMAL LOW (ref 32.0–36.0)
MCV: 90.6 fL (ref 80.0–100.0)
MPV: 9.7 fL (ref 7.5–12.5)
Monocytes Relative: 12.1 %
Neutro Abs: 3906 {cells}/uL (ref 1500–7800)
Neutrophils Relative %: 51.4 %
Platelets: 358 10*3/uL (ref 140–400)
RBC: 4.46 10*6/uL (ref 3.80–5.10)
RDW: 14 % (ref 11.0–15.0)
Total Lymphocyte: 33.9 %
WBC: 7.6 10*3/uL (ref 3.8–10.8)

## 2023-11-30 LAB — TEST AUTHORIZATION

## 2023-11-30 LAB — LIPID PANEL
Cholesterol: 195 mg/dL (ref <200)
HDL: 60 mg/dL (ref >=50)
LDL Cholesterol (Calc): 116 mg/dL — ABNORMAL HIGH
Non-HDL Cholesterol (Calc): 135 mg/dL — ABNORMAL HIGH (ref <130)
Total CHOL/HDL Ratio: 3.3 (calc) (ref <5.0)
Triglycerides: 91 mg/dL (ref <150)

## 2023-11-30 LAB — HEMOGLOBIN A1C
Hgb A1c MFr Bld: 6.1 % — ABNORMAL HIGH (ref <5.7)
Mean Plasma Glucose: 128 mg/dL
eAG (mmol/L): 7.1 mmol/L

## 2024-01-25 ENCOUNTER — Encounter: Payer: Self-pay | Admitting: Pediatrics

## 2024-02-14 ENCOUNTER — Ambulatory Visit: Admitting: Family Medicine

## 2024-02-16 ENCOUNTER — Other Ambulatory Visit: Payer: Self-pay

## 2024-02-16 ENCOUNTER — Telehealth: Payer: Self-pay

## 2024-02-16 DIAGNOSIS — Z1211 Encounter for screening for malignant neoplasm of colon: Secondary | ICD-10-CM

## 2024-02-16 NOTE — Telephone Encounter (Signed)
 Copied from CRM [id number]. Topic: Referral - Status >> Feb 16, 2024 11:26 AM Amber Jimenez wrote: Reason for CRM: Says she is cancelling her appts with [facility]. She says she has family emergencies right now.  Wants to do cologuard instead, please advise. Says she is willing to revisit this next year.

## 2024-02-28 ENCOUNTER — Encounter

## 2024-03-06 ENCOUNTER — Encounter: Admitting: Pediatrics

## 2024-03-13 LAB — COLOGUARD: COLOGUARD: NEGATIVE

## 2024-03-25 ENCOUNTER — Ambulatory Visit: Payer: Self-pay | Admitting: Family Medicine

## 2024-04-11 ENCOUNTER — Telehealth: Payer: Self-pay | Admitting: Family Medicine

## 2024-04-11 NOTE — Telephone Encounter (Signed)
 Copied from CRM #[id number]. Topic: Clinical - Medical Advice >> Apr 11, 2024  3:02 PM Delon DASEN wrote: Reason for CRM: keeps feeling a warm sensation in calf usually in the afternoon/evening- no other symptoms- [phone]

## 2024-04-12 ENCOUNTER — Other Ambulatory Visit (HOSPITAL_BASED_OUTPATIENT_CLINIC_OR_DEPARTMENT_OTHER): Payer: Self-pay | Admitting: Family Medicine

## 2024-04-12 ENCOUNTER — Encounter: Payer: Self-pay | Admitting: Family Medicine

## 2024-04-12 ENCOUNTER — Ambulatory Visit: Admitting: Family Medicine

## 2024-04-12 ENCOUNTER — Ambulatory Visit (HOSPITAL_BASED_OUTPATIENT_CLINIC_OR_DEPARTMENT_OTHER)
Admission: RE | Admit: 2024-04-12 | Discharge: 2024-04-12 | Disposition: A | Discharge status: Home / Self Care | Source: Ambulatory Visit | Attending: Family Medicine | Admitting: Family Medicine

## 2024-04-12 VITALS — BP 136/82 | HR 80 | Temp 98.5°F | Ht 62.0 in | Wt 193.0 lb

## 2024-04-12 DIAGNOSIS — R7303 Prediabetes: Secondary | ICD-10-CM | POA: Diagnosis not present

## 2024-04-12 DIAGNOSIS — Z1231 Encounter for screening mammogram for malignant neoplasm of breast: Secondary | ICD-10-CM

## 2024-04-12 DIAGNOSIS — M79661 Pain in right lower leg: Secondary | ICD-10-CM | POA: Diagnosis present

## 2024-04-12 DIAGNOSIS — E7800 Pure hypercholesterolemia, unspecified: Secondary | ICD-10-CM

## 2024-04-12 DIAGNOSIS — I10 Essential (primary) hypertension: Secondary | ICD-10-CM

## 2024-04-12 NOTE — Progress Notes (Addendum)
 Patient Office Visit  Assessment & Plan:  Right calf pain -     VAS US  LOWER EXTREMITY VENOUS (DVT); Future  Screening mammogram for breast cancer -     3D Screening Mammogram, Left and Right; Future  Hypertension, unspecified type -     CBC with Differential/Platelet -     Comprehensive metabolic panel with GFR  Prediabetes -     Hemoglobin A1c  Elevated LDL cholesterol level -     Lipid panel   Assessment and Plan    Pain in right lower leg/calf pain x 2 weeks Intermittent warmth and swelling with pain, redness. Low suspicion for DVT or cellulitis but patient has been more sedentary - Ordered venous Doppler ultrasound of the right leg to rule out DVT today  Essential hypertension Blood pressure is well-controlled.  Prediabetes- need to recheck labs today  Pure hypercholesterolemia- recheck labs   General Health Maintenance Flu and COVID vaccinations up to date. Mammogram overdue. - Ordered mammogram at a convenient location.     Addendum- venous doppler negative for DVT- patient has seen mychart result. Follow up on labs and notify patient.  Return in about 4 months (around 08/10/2024), or if symptoms worsen or fail to improve.   Subjective:    Patient ID: Amber Jimenez, female    DOB: December 18, 1955  Age: 68 y.o. MRN: [id number]  Chief Complaint  Patient presents with   Leg Problem    Pt c/o of R calf burning sensation. She states she feels it heating up    HPI Discussed the use of AI scribe software for clinical note transcription with the patient, who gave verbal consent to proceed.  History of Present Illness      Amber Jimenez is a 68 year old female who presents with concerns of right leg warmth/calf pain for 2 weeks. patient worried about a possible blood clot.  She has been experiencing warmth in her right leg for the past two weeks, describing it as similar to having a heat pad on it. She is concerned about a possible blood clot, especially  given her family history of a cousin who died from a blood clot following knee surgery. No breathing problems or shortness of breath are present. No previous history of DVT or PE but patient has been more sedentary  She leads a sedentary lifestyle, spending significant time sitting and watching television, particularly [place] soap operas. She has been less active than usual, especially over weekends, and has not been walking as much as she used to. 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 She has received both the flu and COVID-19 vaccinations. She has not had a mammogram in over two years despite previous orders.  She experiences significant stress due to family issues, including her son's wife being in [place] with their child, causing distress in the family. She manages stress through meditation and prayer and recently took a solo trip to the beach for relaxation.  She has been using a mushroom hot chocolate with melatonin at night, which helps her sleep deeply without feeling groggy. Physical Exam EXTREMITIES: Both legs not warm to touch. Right calf non-tender to palpation, no palpable mass/knot Assessment & Plan Pain  in right lower leg/calf pain x 2 weeks Intermittent warmth and swelling with pain, redness. Low suspicion for DVT or cellulitis but patient has been more sedentary - Ordered venous Doppler ultrasound of the right leg to rule out DVT today  Essential hypertension Blood pressure is well-controlled.  Prediabetes- need to recheck labs today. Recommend healthy diet/exercise  Pure hypercholesterolemia- recheck labs. Patient has declined cholesterol medicine in the past.   General Health Maintenance Flu and COVID vaccinations up to  date. Mammogram overdue. - Ordered mammogram at a convenient location.   The 10-year ASCVD risk score (Arnett DK, et al., 2019) is: 12.4%  Past Medical History:  Diagnosis Date   Hypertension    Past Surgical History:  Procedure Laterality Date   TONSILLECTOMY     Social History   Tobacco Use   Smoking status: Never  Substance Use Topics   Alcohol use: Yes    Alcohol/week: 3.0 standard drinks of alcohol    Types: 2 Glasses of wine, 1 Shots of liquor per week   Drug use: No   Family History  Problem Relation Age of Onset   Bone cancer Mother    No Known Allergies  ROS    Objective:    BP 136/82   Pulse 80   Temp 98.5 F (36.9 C)   Ht 5' 2 (1.575 m)   Wt 193 lb (87.5 kg) Comment: with shoes  SpO2 98%   BMI 35.30 kg/m  BP Readings from Last 3 Encounters:  04/12/24 136/82  11/27/23 134/84  01/15/22 (!) 171/84   Wt Readings from Last 3 Encounters:  04/12/24 193 lb (87.5 kg)  11/27/23 197 lb 4 oz (89.5 kg)  11/10/21 189 lb 6.4 oz (85.9 kg)    Physical Exam Vitals and nursing note reviewed.  Constitutional:      General: She is not in acute distress.    Appearance: Normal appearance.  HENT:     Head: Normocephalic.     Right Ear: Tympanic membrane, ear canal and external ear normal.     Left Ear: Tympanic membrane, ear canal and external ear normal.  Eyes:     Extraocular Movements: Extraocular movements intact.     Conjunctiva/sclera: Conjunctivae normal.     Pupils: Pupils are equal, round, and reactive to light.  Cardiovascular:     Rate and Rhythm: Normal rate and regular rhythm.     Heart sounds: Normal heart sounds.  Pulmonary:     Effort: Pulmonary effort is normal.     Breath sounds: Rhonchi present. No wheezing.  Musculoskeletal:     Right lower leg: Tenderness present. No swelling. No edema.     Left lower leg: No swelling. No edema.     Comments: Had mild calf tenderness, no palpable mass appreciated. No redness, not warmth to the  touch   Neurological:     General: No focal deficit present.     Mental Status: She is alert and oriented to person, place, and time.  Psychiatric:        Mood and Affect: Mood normal.        Behavior: Behavior normal.        Thought Content: Thought content normal.        Judgment: Judgment normal.      No results found for any visits on 04/12/24.

## 2024-04-12 NOTE — Addendum Note (Signed)
 Addended by: ALETHA CONNIE HERO on: 04/12/2024 12:00 PM   Modules accepted: Orders

## 2024-04-12 NOTE — Addendum Note (Signed)
 Addended by: ALETHA CONNIE HERO on: 04/12/2024 12:05 PM   Modules accepted: Orders

## 2024-04-13 LAB — CBC WITH DIFFERENTIAL/PLATELET
Absolute Lymphocytes: 2366 {cells}/uL (ref 850–3900)
Absolute Monocytes: 678 {cells}/uL (ref 200–950)
Basophils Absolute: 40 {cells}/uL (ref 0–200)
Basophils Relative: 0.7 %
Eosinophils Absolute: 137 {cells}/uL (ref 15–500)
Eosinophils Relative: 2.4 %
HCT: 39.3 % (ref 35.0–45.0)
Hemoglobin: 12.8 g/dL (ref 11.7–15.5)
MCH: 28.9 pg (ref 27.0–33.0)
MCHC: 32.6 g/dL (ref 32.0–36.0)
MCV: 88.7 fL (ref 80.0–100.0)
MPV: 9.8 fL (ref 7.5–12.5)
Monocytes Relative: 11.9 %
Neutro Abs: 2480 {cells}/uL (ref 1500–7800)
Neutrophils Relative %: 43.5 %
Platelets: 347 Thousand/uL (ref 140–400)
RBC: 4.43 Million/uL (ref 3.80–5.10)
RDW: 12.3 % (ref 11.0–15.0)
Total Lymphocyte: 41.5 %
WBC: 5.7 Thousand/uL (ref 3.8–10.8)

## 2024-04-13 LAB — COMPREHENSIVE METABOLIC PANEL WITH GFR
AG Ratio: 1.4 (calc) (ref 1.0–2.5)
ALT: 13 U/L (ref 6–29)
AST: 17 U/L (ref 10–35)
Albumin: 4.1 g/dL (ref 3.6–5.1)
Alkaline phosphatase (APISO): 114 U/L (ref 37–153)
BUN/Creatinine Ratio: 28 (calc) — ABNORMAL HIGH (ref 6–22)
BUN: 13 mg/dL (ref 7–25)
CO2: 29 mmol/L (ref 20–32)
Calcium: 9.6 mg/dL (ref 8.6–10.4)
Chloride: 102 mmol/L (ref 98–110)
Creat: 0.46 mg/dL — ABNORMAL LOW (ref 0.50–1.05)
Globulin: 3 g/dL (ref 1.9–3.7)
Glucose, Bld: 94 mg/dL (ref 65–99)
Potassium: 4.3 mmol/L (ref 3.5–5.3)
Sodium: 139 mmol/L (ref 135–146)
Total Bilirubin: 0.4 mg/dL (ref 0.2–1.2)
Total Protein: 7.1 g/dL (ref 6.1–8.1)
eGFR: 104 mL/min/1.73m2 (ref >=60)

## 2024-04-13 LAB — HEMOGLOBIN A1C
Hgb A1c MFr Bld: 5.8 % — ABNORMAL HIGH (ref <5.7)
Mean Plasma Glucose: 120 mg/dL
eAG (mmol/L): 6.6 mmol/L

## 2024-04-13 LAB — LIPID PANEL
Cholesterol: 183 mg/dL (ref <200)
HDL: 62 mg/dL (ref >=50)
LDL Cholesterol (Calc): 103 mg/dL — ABNORMAL HIGH
Non-HDL Cholesterol (Calc): 121 mg/dL (ref <130)
Total CHOL/HDL Ratio: 3 (calc) (ref <5.0)
Triglycerides: 87 mg/dL (ref <150)

## 2024-04-15 ENCOUNTER — Ambulatory Visit: Payer: Self-pay | Admitting: Family Medicine

## 2024-05-22 ENCOUNTER — Other Ambulatory Visit: Payer: Self-pay | Admitting: Family Medicine

## 2024-05-22 DIAGNOSIS — I10 Essential (primary) hypertension: Secondary | ICD-10-CM

## 2024-06-03 ENCOUNTER — Other Ambulatory Visit: Payer: Self-pay | Admitting: Family Medicine

## 2024-06-03 NOTE — Telephone Encounter (Signed)
e2

## 2024-06-03 NOTE — Telephone Encounter (Unsigned)
 Copied from CRM [id number]. Topic: Clinical - Medication Refill >> Jun 03, 2024  4:26 PM [name] wrote: Medication: fluticasone  (FLONASE ) 50 MCG/ACT nasal spray  Has the patient contacted their pharmacy? No (Agent: If no, request that the patient contact the pharmacy for the refill. If patient does not wish to contact the pharmacy document the reason why and proceed with request.) (Agent: If yes, when and what did the pharmacy advise?)  This is the patient's preferred pharmacy:  [id number] [place], [place] - [address] AT [facility] [address] [facility] [address] [place] [address] Phone: [phone] Fax: [phone] Hours: Not open 24 hours  Is this the correct pharmacy for this prescription? No If no, delete pharmacy and type the correct one.   Has the prescription been filled recently? Yes  Is the patient out of the medication? Yes  Has the patient been seen for an appointment in the last year OR does the patient have an upcoming appointment? Yes  Can we respond through MyChart? No  Agent: Please be advised that Rx refills may take up to 3 business days. We ask that you follow-up with your pharmacy.

## 2024-06-05 ENCOUNTER — Other Ambulatory Visit: Payer: Self-pay | Admitting: Family Medicine

## 2024-06-05 MED ORDER — FLUTICASONE PROPIONATE 50 MCG/ACT NA SUSP: 2.0000 | Freq: Every day | NASAL | 1 refills | Status: AC

## 2024-06-05 NOTE — Telephone Encounter (Signed)
 Requested medication (s) are due for refill today: yes  Requested medication (s) are on the active medication list: yes  Last refill:  11/11/23  Future visit scheduled: {no  Notes to clinic:  historical medication     Requested Prescriptions  Pending Prescriptions Disp Refills   fluticasone  (FLONASE ) 50 MCG/ACT nasal spray       Ear, Nose, and Throat: Nasal Preparations - Corticosteroids Passed - 06/05/2024 10:27 AM      Passed - Valid encounter within last 12 months    Recent Outpatient Visits           1 month ago Right calf pain   [place] [facility] Aletha Bene, MD   6 months ago Primary hypertension   [place] [facility] Aletha Bene, MD

## 2024-07-03 ENCOUNTER — Encounter: Payer: Self-pay | Admitting: Family Medicine

## 2024-07-03 ENCOUNTER — Ambulatory Visit

## 2024-07-03 ENCOUNTER — Ambulatory Visit: Admitting: Family Medicine

## 2024-07-03 VITALS — BP 120/86 | HR 72 | Ht 62.0 in | Wt 194.4 lb

## 2024-07-03 DIAGNOSIS — I10 Essential (primary) hypertension: Secondary | ICD-10-CM

## 2024-07-03 DIAGNOSIS — Z6835 Body mass index (BMI) 35.0-35.9, adult: Secondary | ICD-10-CM

## 2024-07-03 DIAGNOSIS — E66812 Obesity, class 2: Secondary | ICD-10-CM

## 2024-07-03 MED ORDER — TIRZEPATIDE-WEIGHT MANAGEMENT 2.5 MG/0.5ML ~~LOC~~ SOLN: 2.5000 mg | SUBCUTANEOUS | 1 refills | Status: AC

## 2024-07-03 NOTE — Progress Notes (Signed)
 "  Patient Office Visit  Assessment & Plan:  Primary hypertension  BMI 35.0-35.9,adult -     Tirzepatide -Weight Management; Inject 2.5 mg into the skin once a week.  Dispense: 2 mL; Refill: 1   Assessment and Plan    Adult Wellness Visit Routine wellness visit focused on lifestyle changes for weight management. Discussed mammogram scheduling and insurance for weight loss medications. - Encouraged healthy eating and regular exercise. - Ensured mammogram is scheduled and completed. - Discussed insurance coverage for weight loss medications.  Primary hypertension Blood pressure well-controlled with current medication. - Continue losartan  and amlodipine .  Obesity Discussed weight loss medications and potential side effects. Insurance coverage for Zepbound  uncertain; Wegovy tablets as alternative. - Sent prescription for Zepbound  to pharmacy and checked insurance coverage. - Consider Wegovy tablets if Zepbound  not covered. - Encouraged regular exercise and healthy diet.  Prediabetes Recent labs show improved blood sugar levels. Continued monitoring and lifestyle modifications recommended. - Encouraged healthy diet and regular exercise.  Hyperlipidemia Cholesterol levels improved with current management. Continued medication use recommended. - Continue cholesterol medication.      Return in about 3 months (around 09/30/2024), or if symptoms worsen or fail to improve.   Subjective:    Patient ID: Amber Jimenez, female    DOB: 02-Feb-1956  Age: 69 y.o. MRN: [id number]  Chief Complaint  Patient presents with   Obesity    Pt would like to discuss Zepbound  today.     HPI Discussed the use of AI scribe software for clinical note transcription with the patient, who gave verbal consent to proceed.     History of Present Illness Amber Jimenez is a 69 year old female who presents for a follow-up visit regarding weight management and medication review.  She is actively  making lifestyle changes to aid in weight loss, including adopting a healthier diet consisting of fish, chicken, vegetables, and fruits, and increasing her water intake. Despite these efforts, she has not yet completed her mammogram, which was ordered in November, and plans to do so after achieving her weight loss goals.  She has a history of prediabetes, with recent lab results indicating improved blood sugar levels and cholesterol. She is currently taking losartan  and amlodipine  for blood pressure management, with recent refills in December.  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 She occasionally walks and uses a treadmill but finds it monotonous.  She has completed a Cologuard test for colon cancer screening but has not undergone a colonoscopy. She has a history of cancer, which she considers a significant milestone in her life, and she wants to celebrate her upcoming 69th birthday.  Results Labs HbA1c (04/2024): Improved Cholesterol (04/2024): Improved  Radiology Lower extremity Doppler ultrasound: No evidence of deep vein thrombosis  Assessment and Plan Adult Wellness Visit Routine wellness visit focused on lifestyle changes for weight management. Discussed mammogram scheduling and insurance for weight loss medications. - Encouraged healthy eating and regular exercise. - Ensured mammogram is scheduled and completed- has not done this yet, last mammogram was in 2023 - Discussed insurance coverage for weight loss medications. Patient did Cologuard last year  Primary hypertension Blood pressure well-controlled with current medications. - Continue losartan  and amlodipine .  Obesity Discussed weight loss medications and potential side effects. Insurance coverage for Zepbound  uncertain; Wegovy tablets as alternative. - Sent prescription for Zepbound  to pharmacy and checked insurance coverage. -  Consider [phone] tablets  if Zepbound  not covered. - Encouraged regular exercise and healthy diet especially getting adequate protein  Prediabetes Recent labs show improved blood sugar levels. Continued monitoring and lifestyle modifications recommended. - Encouraged healthy diet and regular exercise.  Hyperlipidemia Cholesterol levels improved with current management. Continued medication use recommended. - Continue cholesterol medication.  The 10-year ASCVD risk score (Arnett DK, et al., 2019) is: 9.5%  Past Medical History:  Diagnosis Date   Hypertension    Past Surgical History:  Procedure Laterality Date   TONSILLECTOMY     Social History[1] Family History  Problem Relation Age of Onset   Bone cancer Mother    Allergies[2]  ROS    Objective:    BP 120/86   Pulse 72   Ht 5' 2 (1.575 m)   Wt 194 lb 6 oz (88.2 kg)   SpO2 99%   BMI 35.55 kg/m  BP Readings from Last 3 Encounters:  07/03/24 120/86  04/12/24 136/82  11/27/23 134/84   Wt Readings from Last 3 Encounters:  07/03/24 194 lb 6 oz (88.2 kg)  04/12/24 193 lb (87.5 kg)  11/27/23 197 lb 4 oz (89.5 kg)    Physical Exam Vitals and nursing note reviewed.  Constitutional:      Appearance: Normal appearance.  HENT:     Head: Normocephalic.     Right Ear: Tympanic membrane, ear canal and external ear normal.     Left Ear: Tympanic membrane, ear canal and external ear normal.  Eyes:     Extraocular Movements: Extraocular movements intact.     Conjunctiva/sclera: Conjunctivae normal.     Pupils: Pupils are equal, round, and reactive to light.  Cardiovascular:     Rate and Rhythm: Normal rate and regular rhythm.     Heart sounds: Normal heart sounds.  Pulmonary:     Effort: Pulmonary effort is normal.     Breath sounds: Normal breath sounds.  Musculoskeletal:     Right lower leg: No edema.     Left lower leg: No edema.  Neurological:     General: No focal deficit present.     Mental Status: She is  alert and oriented to person, place, and time.  Psychiatric:        Mood and Affect: Mood normal.        Behavior: Behavior normal.        Thought Content: Thought content normal.        Judgment: Judgment normal.      No results found for any visits on 07/03/24.          [1]  Social History Tobacco Use   Smoking status: Never  Substance Use Topics   Alcohol use: Yes    Alcohol/week: 3.0 standard drinks of alcohol    Types: 2 Glasses of wine, 1 Shots of liquor per week   Drug use: No  [2] No Known Allergies  "

## 2024-07-04 ENCOUNTER — Other Ambulatory Visit (HOSPITAL_COMMUNITY): Payer: Self-pay

## 2024-07-10 ENCOUNTER — Ambulatory Visit

## 2024-10-02 ENCOUNTER — Ambulatory Visit: Admitting: Family Medicine
